# Patient Record
Sex: Male | Born: 2000 | Hispanic: Yes | Marital: Single | Smoking: Current some day smoker
Health system: Southern US, Community
[De-identification: ages and names within clinical notes are randomized; demographics above are authoritative.]

---
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
## 2019-09-29 ENCOUNTER — Ambulatory Visit: Payer: Self-pay | Attending: Internal Medicine

## 2019-09-29 DIAGNOSIS — Z23 Encounter for immunization: Secondary | ICD-10-CM

## 2019-09-29 NOTE — Progress Notes (Signed)
   Covid-19 Vaccination Clinic  Name:  Andrew Serrano    MRN: [id number] DOB: 02-08-2001  09/29/2019  Mr. Andrew Serrano was observed post Covid-19 immunization for 15 minutes without incident. He was provided with Vaccine Information Sheet and instruction to access the V-Safe system.   Mr. Andrew Serrano was instructed to call 911 with any severe reactions post vaccine: Marland Kitchen Difficulty breathing  . Swelling of face and throat  . A fast heartbeat  . A bad rash all over body  . Dizziness and weakness   Immunizations Administered    Name Date Dose VIS Date Route   Pfizer COVID-19 Vaccine 09/29/2019  3:09 PM 0.3 mL 06/21/2019 Intramuscular   Manufacturer: [employer], [employer]   Lot: [id number]   NDC: [address]-5

## 2019-10-20 ENCOUNTER — Ambulatory Visit: Payer: Self-pay

## 2019-10-20 ENCOUNTER — Ambulatory Visit: Payer: Self-pay | Attending: Internal Medicine

## 2019-10-20 DIAGNOSIS — Z23 Encounter for immunization: Secondary | ICD-10-CM

## 2019-10-20 NOTE — Progress Notes (Signed)
   Covid-19 Vaccination Clinic  Name:  Xavi Tomasik    MRN: [id number] DOB: 2001-06-19  10/20/2019  Mr. Galvin Aversa was observed post Covid-19 immunization for 15 minutes without incident. He was provided with Vaccine Information Sheet and instruction to access the V-Safe system.   Mr. Xzavior Reinig was instructed to call 911 with any severe reactions post vaccine: Marland Kitchen Difficulty breathing  . Swelling of face and throat  . A fast heartbeat  . A bad rash all over body  . Dizziness and weakness   Immunizations Administered    Name Date Dose VIS Date Route   Pfizer COVID-19 Vaccine 10/20/2019  2:54 PM 0.3 mL 06/21/2019 Intramuscular   Manufacturer: [employer], [employer]   Lot: [id number]   NDC: [address]-8

## 2021-05-19 ENCOUNTER — Emergency Department
Admission: EM | Admit: 2021-05-19 | Discharge: 2021-05-19 | Disposition: A | Discharge status: Home / Self Care | Payer: Self-pay | Attending: Emergency Medicine | Admitting: Emergency Medicine

## 2021-05-19 ENCOUNTER — Other Ambulatory Visit: Payer: Self-pay

## 2021-05-19 ENCOUNTER — Emergency Department: Payer: Self-pay

## 2021-05-19 DIAGNOSIS — W5501XD Bitten by cat, subsequent encounter: Secondary | ICD-10-CM | POA: Insufficient documentation

## 2021-05-19 DIAGNOSIS — Z23 Encounter for immunization: Secondary | ICD-10-CM | POA: Insufficient documentation

## 2021-05-19 DIAGNOSIS — S91031D Puncture wound without foreign body, right ankle, subsequent encounter: Secondary | ICD-10-CM | POA: Insufficient documentation

## 2021-05-19 DIAGNOSIS — Z2914 Encounter for prophylactic rabies immune globin: Secondary | ICD-10-CM | POA: Insufficient documentation

## 2021-05-19 MED ORDER — IBUPROFEN 400 MG PO TABS
400.0000 mg | ORAL_TABLET | Freq: Once | ORAL | Status: AC
  Administered 2021-05-19: 18:00:00 400 mg via ORAL
  Filled 2021-05-19: qty 1

## 2021-05-19 MED ORDER — RABIES VACCINE, PCEC IM SUSR
1.0000 mL | Freq: Once | INTRAMUSCULAR | Status: AC
  Administered 2021-05-19: 18:00:00 1 mL via INTRAMUSCULAR
  Filled 2021-05-19: qty 1

## 2021-05-19 MED ORDER — ACETAMINOPHEN 500 MG PO TABS
1000.0000 mg | ORAL_TABLET | Freq: Once | ORAL | Status: AC
  Administered 2021-05-19: 18:00:00 1000 mg via ORAL
  Filled 2021-05-19: qty 2

## 2021-05-19 MED ORDER — RABIES IMMUNE GLOBULIN 150 UNIT/ML IM INJ
20.0000 [IU]/kg | INJECTION | Freq: Once | INTRAMUSCULAR | Status: AC
  Administered 2021-05-19: 1425 [IU] via INTRAMUSCULAR
  Filled 2021-05-19: qty 9.5

## 2021-05-19 NOTE — ED Provider Notes (Signed)
[facility] Emergency Department Provider Note  ____________________________________________   Event Date/Time   First MD Initiated Contact with Patient 05/19/21 1654     (approximate)  I have reviewed the triage vital signs and the nursing notes.   HISTORY  Chief Complaint Animal Bite   HPI Andrew Serrano is a 20 y.o. male without significant past medical history who presents for assessment stating he thinks he needs a rabies vaccine after he sustained a cat bite to the right ankle and foot yesterday.  Patient notes he was seen in urgent care and had his tetanus updated and was started on Augmentin but did not get the rabies vaccine and he was waiting to get the records for [place].  He states he adopted his current cath about 6 months ago and after getting records it seems the cat has only received partial rabies series.  Patient states that he otherwise has not had any significant pain other than some mild soreness around the bites in the foot including no pain in the knee, chest, abdomen, back, head, or elsewhere.  She denies any recent fevers, chills, cough, vomiting, diarrhea, burning with urination or any other acute sick symptoms.  No other recent injuries or falls or bites.  He was not been anywhere other than the right foot.         History reviewed. No pertinent past medical history.  There are no problems to display for this patient.   History reviewed. No pertinent surgical history.  Prior to Admission medications   Not on File    Allergies Patient has no known allergies.  No family history on file.  Social History Social History   Tobacco Use   Smoking status: Never   Smokeless tobacco: Never  Substance Use Topics   Alcohol use: No   Drug use: No    Review of Systems  Review of Systems  Constitutional:  Negative for chills and fever.  HENT:  Negative for sore throat.   Eyes:  Negative for pain.  Respiratory:   Negative for cough and stridor.   Cardiovascular:  Negative for chest pain.  Gastrointestinal:  Negative for vomiting.  Musculoskeletal:  Positive for myalgias (R mild discomfort).  Skin:  Negative for rash.  Neurological:  Negative for seizures, loss of consciousness and headaches.  Psychiatric/Behavioral:  Negative for suicidal ideas.   All other systems reviewed and are negative.    ____________________________________________   PHYSICAL EXAM:  VITAL SIGNS: ED Triage Vitals  Enc Vitals Group     BP 05/19/21 1516 131/84     Pulse Rate 05/19/21 1516 64     Resp 05/19/21 1516 16     Temp 05/19/21 1516 99.1 F (37.3 C)     Temp Source 05/19/21 1516 Oral     SpO2 05/19/21 1516 98 %     Weight 05/19/21 1501 160 lb (72.6 kg)     Height 05/19/21 1501 5\' 5"  (1.651 m)     Head Circumference --      Peak Flow --      Pain Score 05/19/21 1501 4     Pain Loc --      Pain Edu? --      Excl. in GC? --    Vitals:   05/19/21 1516  BP: 131/84  Pulse: 64  Resp: 16  Temp: 99.1 F (37.3 C)  SpO2: 98%   Physical Exam Vitals and nursing note reviewed.  Constitutional:  Appearance: He is well-developed.  HENT:     Head: Normocephalic and atraumatic.     Right Ear: External ear normal.     Left Ear: External ear normal.     Nose: Nose normal.  Eyes:     Conjunctiva/sclera: Conjunctivae normal.  Cardiovascular:     Rate and Rhythm: Normal rate and regular rhythm.     Heart sounds: No murmur heard. Pulmonary:     Effort: Pulmonary effort is normal. No respiratory distress.     Breath sounds: Normal breath sounds.  Abdominal:     Palpations: Abdomen is soft.     Tenderness: There is no abdominal tenderness.  Musculoskeletal:     Cervical back: Neck supple.  Skin:    General: Skin is warm and dry.  Neurological:     Mental Status: He is alert and oriented to person, place, and time.  Psychiatric:        Mood and Affect: Mood normal.    There appears be 2 very small  scabbed over puncture wounds on the inferior aspect of the medial malleolus and a scratch over the dorsum of the foot and possibly a puncture wound on the posterior aspect of the lateral malleolus.  2+ DP pulses.  Sensation is full sensation to light touch throughout the foot.  He has full strength at the ankle and is able to move all toes.  There is no effusion or significant edema induration purulent drainage or other skin changes. ____________________________________________   LABS (all labs ordered are listed, but only abnormal results are displayed)  Labs Reviewed - No data to display ____________________________________________  EKG   ____________________________________________  RADIOLOGY  ED MD interpretation: Film of the right ankle shows no retained teeth or underlying bony injury.  Official radiology report(s): DG Ankle Complete Right  Result Date: 05/19/2021 CLINICAL DATA:  Cat bite sustained yesterday. EXAM: RIGHT ANKLE - COMPLETE 3+ VIEW COMPARISON:  None. FINDINGS: There is no evidence of fracture, dislocation, or joint effusion. There is no evidence of arthropathy or other focal bone abnormality. Soft tissues are unremarkable. IMPRESSION: Normal radiographs. Electronically Signed   By: Paulina Fusi M.D.   On: 05/19/2021 17:26    ____________________________________________   PROCEDURES  Procedure(s) performed (including Critical Care):  Procedures   ____________________________________________   INITIAL IMPRESSION / ASSESSMENT AND PLAN / ED COURSE      Patient presents with above-stated history exam after reportedly being bit by his cat yesterday.  It seems he was seen in urgent care where he had his tetanus updated and was started on Augmentin but now thinks he needs to have rabies vaccine started as his cat is 11 partially vaccinated.  On arrival patient is afebrile hemodynamically stable.  On exam he does what appears to be a superficial scratch on the  dorsum of the foot and a couple small puncture wounds noted above.  X-ray shows no retained teeth or underlying bony injury.  He is otherwise neurovascularly intact and I have a low suspicion for other significant trauma or compartment syndrome.  He is on appropriate antibiotics.  Given he was bit by a cat that has not been fully up-to-date on rabies we will start series in the emergency room including 1 dose of immunoglobulin.  Discussed with patient's portance of close outpatient urgent care follow-up to complete full series and returning immediately if he experiences any new or worsening pain in his foot, fevers, spreading redness or purulent drainage.  He is amenable with plan.  Discharged stable condition.  Strict return precautions advised and discussed.       ____________________________________________   FINAL CLINICAL IMPRESSION(S) / ED DIAGNOSES  Final diagnoses:  Cat bite, subsequent encounter    Medications  rabies vaccine (RABAVERT) injection 1 mL (1 mL Intramuscular Given 05/19/21 1801)  rabies immune globulin (HYPERAB/KEDRAB) injection 1,425 Units (1,425 Units Intramuscular Given 05/19/21 1800)  acetaminophen (TYLENOL) tablet 1,000 mg (1,000 mg Oral Given 05/19/21 1759)  ibuprofen (ADVIL) tablet 400 mg (400 mg Oral Given 05/19/21 1800)     ED Discharge Orders     None        Note:  This document was prepared using Dragon voice recognition software and may include unintentional dictation errors.    Gilles Chiquito, MD 05/19/21 [phone]

## 2021-05-19 NOTE — ED Triage Notes (Signed)
Pt to ED for second opinion on cat bite that occurred yesterday. Pt reports was seen by Dr and px antibiotics. Now needs rabies vaccines. Bite to right foot

## 2021-05-22 ENCOUNTER — Ambulatory Visit
Admission: EM | Admit: 2021-05-22 | Discharge: 2021-05-22 | Disposition: A | Discharge status: Home / Self Care | Payer: Self-pay | Attending: Emergency Medicine | Admitting: Emergency Medicine

## 2021-05-22 ENCOUNTER — Encounter: Payer: Self-pay | Admitting: Emergency Medicine

## 2021-05-22 ENCOUNTER — Other Ambulatory Visit: Payer: Self-pay

## 2021-05-22 DIAGNOSIS — Z23 Encounter for immunization: Secondary | ICD-10-CM

## 2021-05-22 MED ORDER — RABIES VACCINE, PCEC IM SUSR
1.0000 mL | Freq: Once | INTRAMUSCULAR | Status: AC
  Administered 2021-05-22: 1 mL via INTRAMUSCULAR

## 2021-05-22 NOTE — ED Triage Notes (Signed)
Pt presents for 2nd Rabies Vaccine. 

## 2021-06-02 ENCOUNTER — Other Ambulatory Visit: Payer: Self-pay

## 2021-06-02 ENCOUNTER — Ambulatory Visit
Admission: EM | Admit: 2021-06-02 | Discharge: 2021-06-02 | Disposition: A | Discharge status: Home / Self Care | Payer: Self-pay | Attending: Internal Medicine | Admitting: Internal Medicine

## 2021-06-02 DIAGNOSIS — Z203 Contact with and (suspected) exposure to rabies: Secondary | ICD-10-CM

## 2021-06-02 MED ORDER — RABIES VACCINE, PCEC IM SUSR
1.0000 mL | Freq: Once | INTRAMUSCULAR | Status: AC
  Administered 2021-06-02: 1 mL via INTRAMUSCULAR

## 2021-06-02 NOTE — ED Triage Notes (Signed)
Pt here for last rabies shot. Received the last one in [place] in [place].

## 2021-07-09 ENCOUNTER — Emergency Department
Admission: EM | Admit: 2021-07-09 | Discharge: 2021-07-09 | Disposition: A | Discharge status: Home / Self Care | Payer: Self-pay | Attending: Emergency Medicine | Admitting: Emergency Medicine

## 2021-07-09 ENCOUNTER — Emergency Department: Payer: Self-pay

## 2021-07-09 ENCOUNTER — Other Ambulatory Visit: Payer: Self-pay

## 2021-07-09 DIAGNOSIS — R109 Unspecified abdominal pain: Secondary | ICD-10-CM | POA: Insufficient documentation

## 2021-07-09 DIAGNOSIS — R079 Chest pain, unspecified: Secondary | ICD-10-CM

## 2021-07-09 DIAGNOSIS — R197 Diarrhea, unspecified: Secondary | ICD-10-CM | POA: Insufficient documentation

## 2021-07-09 DIAGNOSIS — Z20822 Contact with and (suspected) exposure to covid-19: Secondary | ICD-10-CM | POA: Insufficient documentation

## 2021-07-09 DIAGNOSIS — R0789 Other chest pain: Secondary | ICD-10-CM | POA: Insufficient documentation

## 2021-07-09 DIAGNOSIS — R6883 Chills (without fever): Secondary | ICD-10-CM | POA: Insufficient documentation

## 2021-07-09 LAB — CBC
HCT: 45.8 % (ref 39.0–52.0)
Hemoglobin: 15.8 g/dL (ref 13.0–17.0)
MCH: 28.6 pg (ref 26.0–34.0)
MCHC: 34.5 g/dL (ref 30.0–36.0)
MCV: 83 fL (ref 80.0–100.0)
Platelets: 256 10*3/uL (ref 150–400)
RBC: 5.52 MIL/uL (ref 4.22–5.81)
RDW: 13.1 % (ref 11.5–15.5)
WBC: 6.6 10*3/uL (ref 4.0–10.5)
nRBC: 0 % (ref 0.0–0.2)

## 2021-07-09 LAB — HEPATIC FUNCTION PANEL
ALT: 37 U/L (ref 0–44)
AST: 23 U/L (ref 15–41)
Albumin: 4.3 g/dL (ref 3.5–5.0)
Alkaline Phosphatase: 70 U/L (ref 38–126)
Bilirubin, Direct: <0.1 mg/dL (ref 0.0–0.2)
Total Bilirubin: 0.7 mg/dL (ref 0.3–1.2)
Total Protein: 7.3 g/dL (ref 6.5–8.1)

## 2021-07-09 LAB — RESP PANEL BY RT-PCR (FLU A&B, COVID) ARPGX2
Influenza A by PCR: NEGATIVE
Influenza B by PCR: NEGATIVE
SARS Coronavirus 2 by RT PCR: NEGATIVE

## 2021-07-09 LAB — BASIC METABOLIC PANEL
Anion gap: 6 (ref 5–15)
BUN: 10 mg/dL (ref 6–20)
CO2: 27 mmol/L (ref 22–32)
Calcium: 9.1 mg/dL (ref 8.9–10.3)
Chloride: 103 mmol/L (ref 98–111)
Creatinine, Ser: 0.68 mg/dL (ref 0.61–1.24)
GFR, Estimated: >60 mL/min (ref >60)
Glucose, Bld: 101 mg/dL — ABNORMAL HIGH (ref 70–99)
Potassium: 3.8 mmol/L (ref 3.5–5.1)
Sodium: 136 mmol/L (ref 135–145)

## 2021-07-09 LAB — URINALYSIS, ROUTINE W REFLEX MICROSCOPIC
Bilirubin Urine: NEGATIVE
Glucose, UA: NEGATIVE mg/dL
Hgb urine dipstick: NEGATIVE
Ketones, ur: NEGATIVE mg/dL
Leukocytes,Ua: NEGATIVE
Nitrite: NEGATIVE
Protein, ur: NEGATIVE mg/dL
Specific Gravity, Urine: 1.018 (ref 1.005–1.030)
pH: 5 (ref 5.0–8.0)

## 2021-07-09 LAB — TROPONIN I (HIGH SENSITIVITY)
Troponin I (High Sensitivity): 4 ng/L (ref <18)
Troponin I (High Sensitivity): 9 ng/L (ref <18)

## 2021-07-09 LAB — LIPASE, BLOOD: Lipase: 25 U/L (ref 11–51)

## 2021-07-09 MED ORDER — ACETAMINOPHEN 500 MG PO TABS
1000.0000 mg | ORAL_TABLET | Freq: Once | ORAL | Status: AC
  Administered 2021-07-09: 08:00:00 1000 mg via ORAL
  Filled 2021-07-09: qty 2

## 2021-07-09 NOTE — ED Provider Notes (Signed)
[facility] Emergency Department Provider Note  ____________________________________________   Event Date/Time   First MD Initiated Contact with Patient 07/09/21 [phone]     (approximate)  I have reviewed the triage vital signs and the nursing notes.   HISTORY  Chief Complaint Chest Pain   HPI Andrew Serrano is a 20 y.o. male without significant past medical history who presents for assessment of a constellation of symptoms including some bilateral chest tightness associate with some crampy abdominal pain and a little diarrhea.  Yesterday.  Seems the chest tightness started yesterday.  Patient states he felt some chills the night before.  No fevers, cough, shortness of breath, back pain, headache, earache, sore throat, vomiting, urinary symptoms, rash or extremity pain.  Comparison none episodes.  He denies EtOH use illicit drug use or tobacco abuse.  No other acute concerns at this time.  No analgesia prior to arrival.         History reviewed. No pertinent past medical history.  There are no problems to display for this patient.   History reviewed. No pertinent surgical history.  Prior to Admission medications   Not on File    Allergies Patient has no known allergies.  History reviewed. No pertinent family history.  Social History Social History   Tobacco Use   Smoking status: Never   Smokeless tobacco: Never  Vaping Use   Vaping Use: Never used  Substance Use Topics   Alcohol use: No   Drug use: No    Review of Systems  Review of Systems  Constitutional:  Positive for chills. Negative for fever.  HENT:  Negative for sore throat.   Eyes:  Negative for pain.  Respiratory:  Negative for cough and stridor.   Cardiovascular:  Positive for chest pain.  Gastrointestinal:  Positive for abdominal pain and diarrhea. Negative for vomiting.  Skin:  Negative for rash.  Neurological:  Negative for seizures, loss of consciousness and  headaches.  Psychiatric/Behavioral:  Negative for suicidal ideas.   All other systems reviewed and are negative.    ____________________________________________   PHYSICAL EXAM:  VITAL SIGNS: ED Triage Vitals  Enc Vitals Group     BP 07/09/21 0044 (!) 121/109     Pulse Rate 07/09/21 0044 92     Resp 07/09/21 0044 18     Temp 07/09/21 0044 98.7 F (37.1 C)     Temp Source 07/09/21 0044 Oral     SpO2 07/09/21 0044 98 %     Weight 07/09/21 0046 180 lb (81.6 kg)     Height 07/09/21 0046 5\' 5"  (1.651 m)     Head Circumference --      Peak Flow --      Pain Score 07/09/21 0044 7     Pain Loc --      Pain Edu? --      Excl. in GC? --    Vitals:   07/09/21 0631 07/09/21 0758  BP: (!) 139/105 (!) 138/98  Pulse: 73 70  Resp: 18 18  Temp: 97.8 F (36.6 C)   SpO2: 98% 98%   Physical Exam Vitals and nursing note reviewed.  Constitutional:      General: He is not in acute distress.    Appearance: He is well-developed.  HENT:     Head: Normocephalic and atraumatic.  Eyes:     Conjunctiva/sclera: Conjunctivae normal.  Cardiovascular:     Rate and Rhythm: Normal rate and regular rhythm.  Heart sounds: No murmur heard. Pulmonary:     Effort: Pulmonary effort is normal. No respiratory distress.     Breath sounds: Normal breath sounds.  Abdominal:     Palpations: Abdomen is soft.     Tenderness: There is no abdominal tenderness.  Musculoskeletal:        General: No swelling.     Cervical back: Neck supple.  Skin:    General: Skin is warm and dry.     Capillary Refill: Capillary refill takes less than 2 seconds.  Neurological:     Mental Status: He is alert.  Psychiatric:        Mood and Affect: Mood normal.     ____________________________________________   LABS (all labs ordered are listed, but only abnormal results are displayed)  Labs Reviewed  BASIC METABOLIC PANEL - Abnormal; Notable for the following components:      Result Value   Glucose, Bld 101 (*)     All other components within normal limits  URINALYSIS, ROUTINE W REFLEX MICROSCOPIC - Abnormal; Notable for the following components:   Color, Urine YELLOW (*)    APPearance CLEAR (*)    All other components within normal limits  RESP PANEL BY RT-PCR (FLU A&B, COVID) ARPGX2  CBC  HEPATIC FUNCTION PANEL  LIPASE, BLOOD  TROPONIN I (HIGH SENSITIVITY)  TROPONIN I (HIGH SENSITIVITY)   ____________________________________________  EKG  ECG remarkable sinus rhythm with sinus arrhythmia with a ventricular rate of 96, normal axis, unremarkable intervals without evidence of acute ischemia or significant arrhythmia. ____________________________________________  RADIOLOGY  ED MD interpretation: Chest x-ray shows no focal consolidation, effusion, edema, pneumothorax or other clear acute thoracic process.  Official radiology report(s): DG Chest 2 View  Result Date: 07/09/2021 CLINICAL DATA:  Chest pain. EXAM: CHEST - 2 VIEW COMPARISON:  None. FINDINGS: The heart size and mediastinal contours are within normal limits. Both lungs are clear. The visualized skeletal structures are unremarkable. IMPRESSION: No active cardiopulmonary disease. Electronically Signed   By: Elgie Collard M.D.   On: 07/09/2021 01:08    ____________________________________________   PROCEDURES  Procedure(s) performed (including Critical Care):  Procedures   ____________________________________________   INITIAL IMPRESSION / ASSESSMENT AND PLAN / ED COURSE        Patient presents with above described history and exam.  On arrival he is afebrile hemodynamically stable.  Primary differential includes acute viral process, ACS, arrhythmia, PE, pneumonia, enteritis, cholecystitis, pancreatitis and metabolic derangements.  ECG remarkable sinus rhythm with sinus arrhythmia with a ventricular rate of 96, normal axis, unremarkable intervals without evidence of acute ischemia or significant arrhythmia.  Given  nonelevated troponin x2 Evalose patient for ACS or myocarditis.  Low suspicion for PE as patient is PERC negative.  Very low suspicion for dissection at this time.  COVID and influenza test is negative.  Hepatic function panel is unremarkable and there is no focal tenderness on upper quadrant to suggest cholestatic process.  No right lower quadrant focal tenderness to suggest appendicitis.  UA is unremarkable for evidence of blood or infection.  Lipase not consistent pancreatitis.  CBC without leukocytosis or acute anemia.  BMP without significant electrolyte or metabolic derangements  Chest x-ray shows no focal consolidation, effusion, edema, pneumothorax or other clear acute thoracic process.  Overall low suspicion for immediate life-threatening process and given stable vitals with reassuring exam work-up I think patient stable for discharge with outpatient follow-up.  Discharged in stable condition.  Strict return precautions advised and discussed.  Impression is  likely acute viral syndrome.      ____________________________________________   FINAL CLINICAL IMPRESSION(S) / ED DIAGNOSES  Final diagnoses:  Chest pain, unspecified type    Medications  acetaminophen (TYLENOL) tablet 1,000 mg (1,000 mg Oral Given 07/09/21 7628)     ED Discharge Orders     None        Note:  This document was prepared using Dragon voice recognition software and may include unintentional dictation errors.    Gilles Chiquito, MD 07/09/21 1256

## 2021-07-09 NOTE — ED Triage Notes (Signed)
Pt to ED via POV. Pt started having chest tightness this afternoon, had nausea and abdominal pain yesterday, has had shortness of breath.  Pt c/o fever last night.  NAD noted. Pt states he has been under quite a bit of stress lately as well.

## 2021-07-09 NOTE — ED Notes (Addendum)
See triage note  presents with some upper abd pain and chest pain describes pain as tightness  NAD on arrival to room

## 2022-04-30 IMAGING — CR DG CHEST 2V
2 series · 2 of 2 positions shown · non-contrast
Comparison: None.

CLINICAL DATA: Chest pain.

EXAM:
CHEST - 2 VIEW

[chest pa]
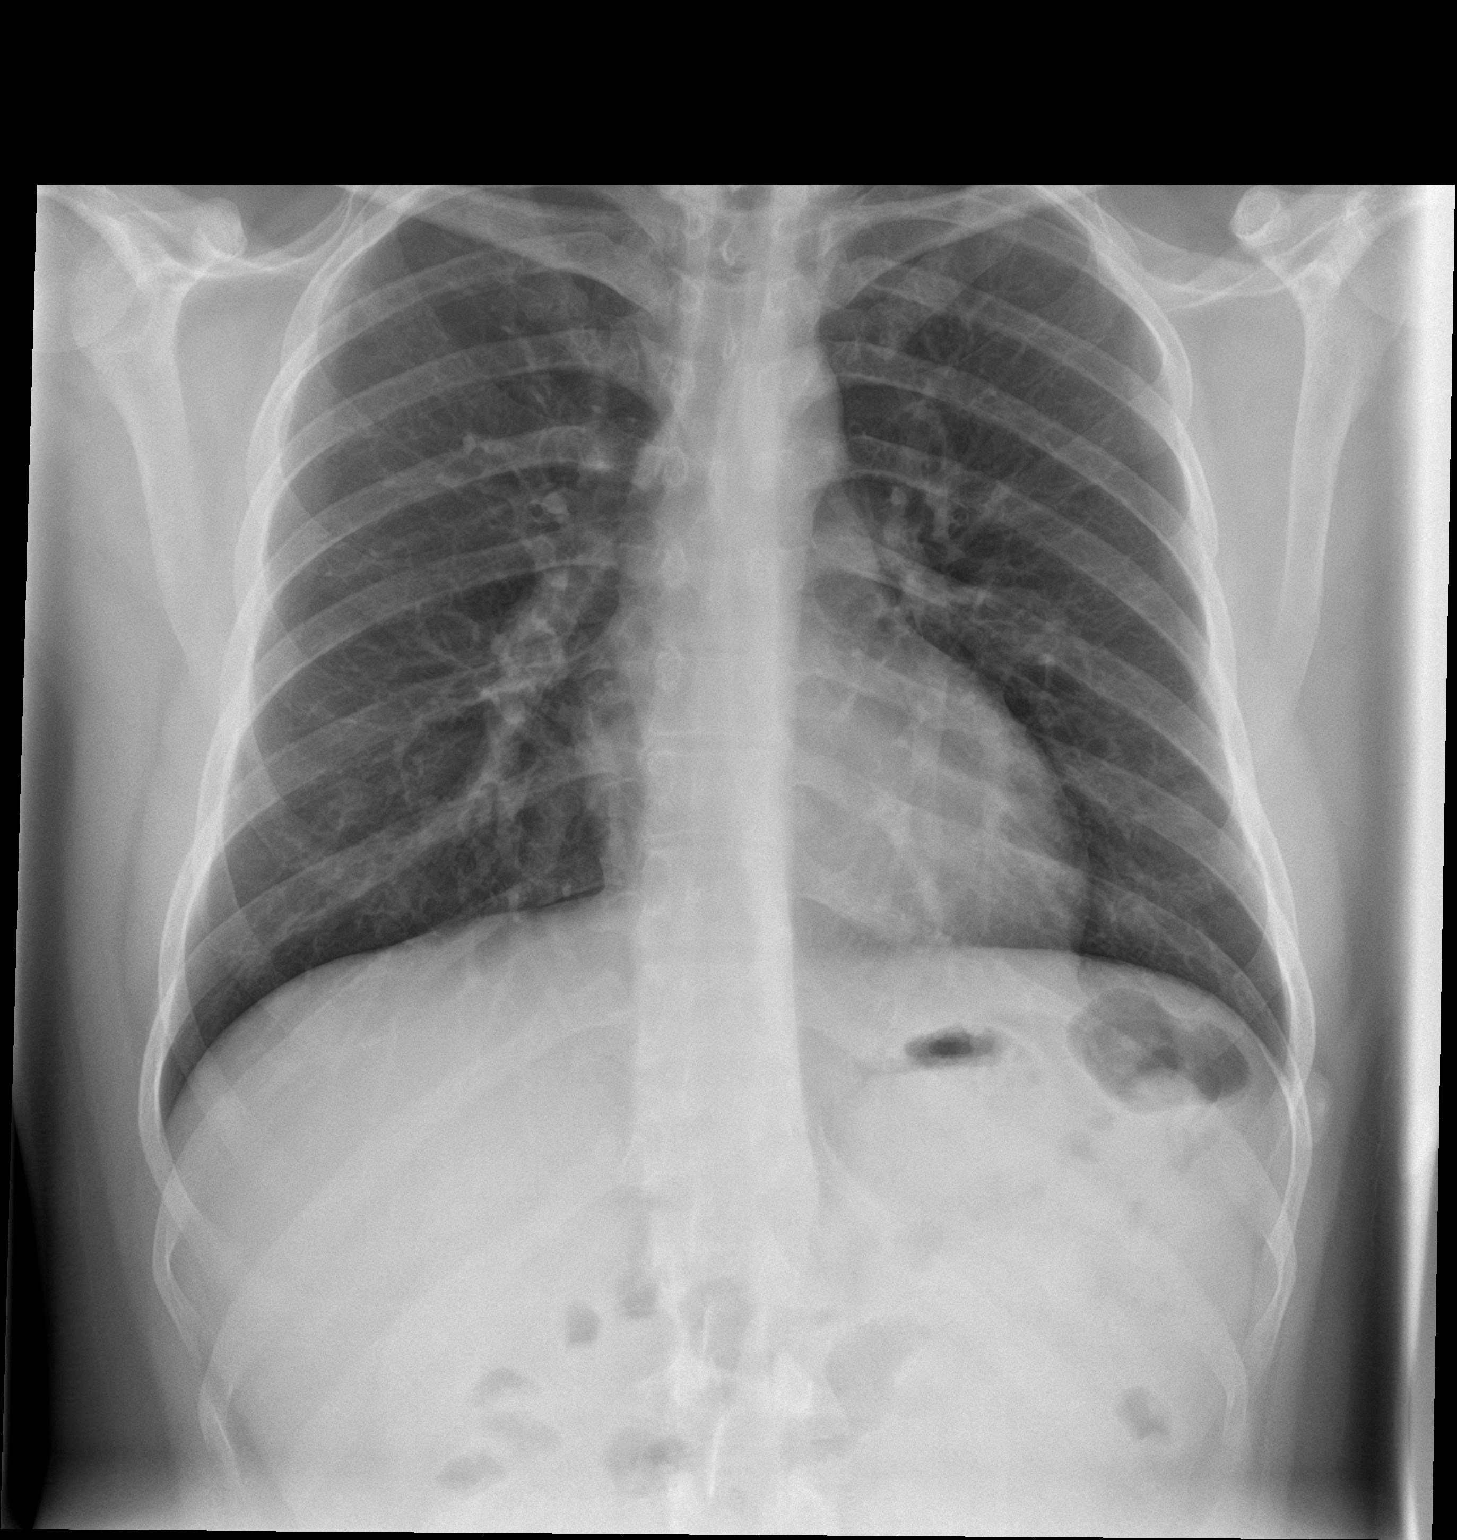

[chest lat]
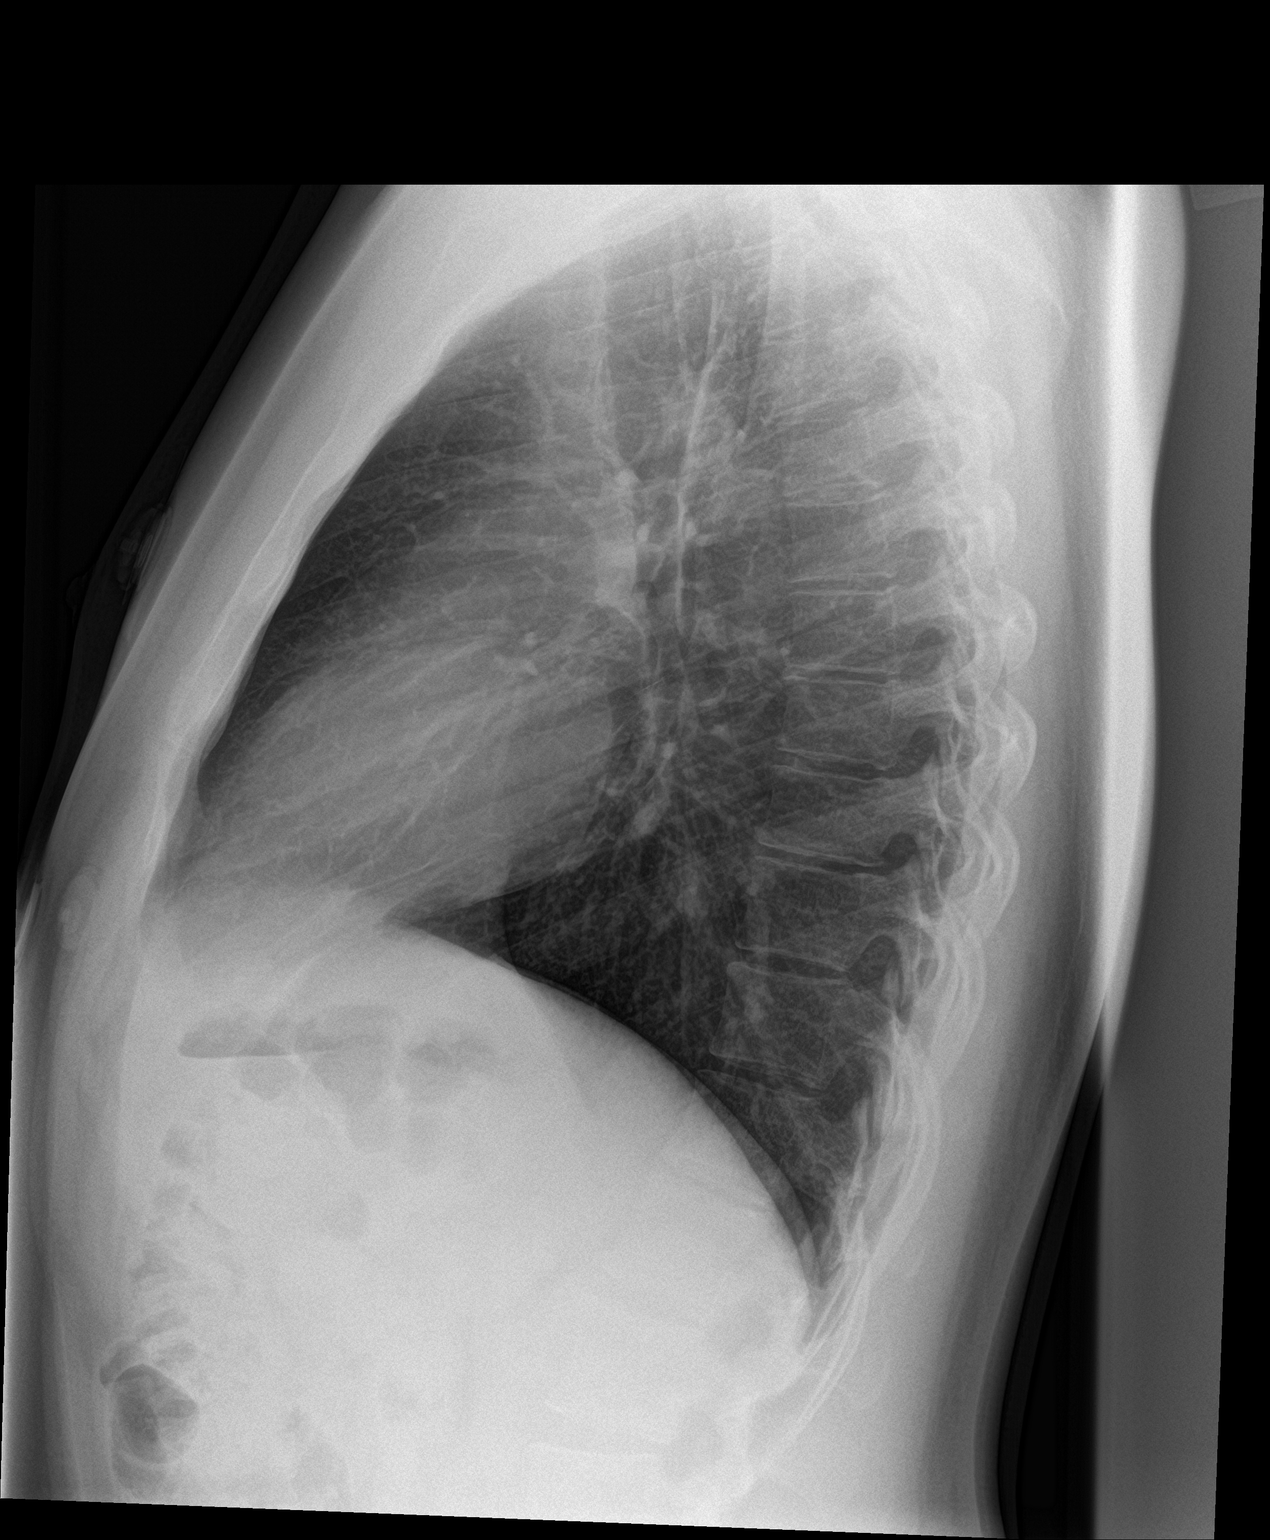

[2 of 2 positions shown; findings below may reference images not displayed]

FINDINGS: The heart size and mediastinal contours are within normal limits.
Both lungs are clear. The visualized skeletal structures are
unremarkable.
IMPRESSION: No active cardiopulmonary disease.

## 2022-11-10 DIAGNOSIS — F439 Reaction to severe stress, unspecified: Secondary | ICD-10-CM | POA: Insufficient documentation

## 2022-11-11 ENCOUNTER — Other Ambulatory Visit: Payer: Self-pay

## 2022-11-11 ENCOUNTER — Emergency Department
Admission: EM | Admit: 2022-11-11 | Discharge: 2022-11-11 | Disposition: A | Discharge status: Home / Self Care | Payer: Self-pay | Attending: Emergency Medicine | Admitting: Emergency Medicine

## 2022-11-11 ENCOUNTER — Encounter: Payer: Self-pay | Admitting: Emergency Medicine

## 2022-11-11 ENCOUNTER — Emergency Department: Payer: Self-pay

## 2022-11-11 DIAGNOSIS — R0789 Other chest pain: Secondary | ICD-10-CM

## 2022-11-11 LAB — CBC
HCT: 44.4 % (ref 39.0–52.0)
Hemoglobin: 15.4 g/dL (ref 13.0–17.0)
MCH: 29.4 pg (ref 26.0–34.0)
MCHC: 34.7 g/dL (ref 30.0–36.0)
MCV: 84.7 fL (ref 80.0–100.0)
Platelets: 283 10*3/uL (ref 150–400)
RBC: 5.24 MIL/uL (ref 4.22–5.81)
RDW: 12.9 % (ref 11.5–15.5)
WBC: 9 10*3/uL (ref 4.0–10.5)
nRBC: 0 % (ref 0.0–0.2)

## 2022-11-11 LAB — BASIC METABOLIC PANEL
Anion gap: 10 (ref 5–15)
BUN: 15 mg/dL (ref 6–20)
CO2: 25 mmol/L (ref 22–32)
Calcium: 9.4 mg/dL (ref 8.9–10.3)
Chloride: 103 mmol/L (ref 98–111)
Creatinine, Ser: 0.81 mg/dL (ref 0.61–1.24)
GFR, Estimated: >60 mL/min (ref >60)
Glucose, Bld: 105 mg/dL — ABNORMAL HIGH (ref 70–99)
Potassium: 3.2 mmol/L — ABNORMAL LOW (ref 3.5–5.1)
Sodium: 138 mmol/L (ref 135–145)

## 2022-11-11 LAB — TROPONIN I (HIGH SENSITIVITY)
Troponin I (High Sensitivity): 3 ng/L (ref <18)
Troponin I (High Sensitivity): 4 ng/L (ref <18)

## 2022-11-11 LAB — D-DIMER, QUANTITATIVE: D-Dimer, Quant: <0.27 ug/mL-FEU (ref 0.00–0.50)

## 2022-11-11 NOTE — ED Provider Notes (Signed)
[facility] Provider Note    Event Date/Time   First MD Initiated Contact with Patient 11/11/22 0153     (approximate)   History   Chest Pain   HPI Andrew Serrano is a 22 y.o. male who is otherwise healthy.  He presents for evaluation of a couple of episodes of some mild left-sided chest pain that occasionally radiates into his left shoulder and his left arm.  This is new for him so he took noted that it was somewhat concerned.  He has not had any shortness of breath, nausea, vomiting, episodes of sweating, lightheadedness, dizziness, nor passing out.  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  He has no history of blood clots in his legs or his lungs and has not had any unilateral leg pain or swelling.  No recent surgeries nor immobilizations.  He is currently asymptomatic.     Physical Exam   Triage Vital Signs: ED Triage Vitals  Enc Vitals Group     BP 11/11/22 0002 (!) 143/103     Pulse Rate 11/11/22 0002 (!) 104     Resp 11/11/22 0002 20     Temp 11/11/22 0002 98.5 F (36.9 C)     Temp Source 11/11/22 0002 Oral     SpO2 11/11/22 0002 100 %     Weight 11/11/22 0007 77.1 kg (170 lb)     Height 11/11/22 0007 1.676 m (5\' 6" )     Head Circumference --      Peak Flow --      Pain Score --      Pain Loc --      Pain Edu? --      Excl. in GC? --     Most recent vital signs: Vitals:   11/11/22 0230 11/11/22 0300  BP: 133/84 134/86  Pulse: 77 80  Resp: 18   Temp:    SpO2: 97% 98%    General: Awake, no distress.  CV:  Good peripheral perfusion.  Normal heart sounds, regular rate and rhythm.  No pain in the chest resulting from either leaning forward or lying supine.  No reproducible chest wall tenderness. Resp:  Normal effort. Speaking easily and comfortably, no accessory muscle  usage nor intercostal retractions.  Lungs are clear to auscultation. Abd:  No distention.  No tenderness to palpation of the upper abdomen with negative Murphy sign.   ED Results / Procedures / Treatments   Labs (all labs ordered are listed, but only abnormal results are displayed) Labs Reviewed  BASIC METABOLIC PANEL - Abnormal; Notable for the following components:      Result Value   Potassium 3.2 (*)    Glucose, Bld 105 (*)    All other components within normal limits  CBC  D-DIMER, QUANTITATIVE (NOT AT [facility])  TROPONIN I (HIGH SENSITIVITY)  TROPONIN I (HIGH SENSITIVITY)     EKG  ED ECG REPORT I, Loleta Rose, the attending physician, personally viewed and interpreted this ECG.  Date: 11/11/2022 EKG Time: 00: 04 Rate: 101 Rhythm: normal sinus rhythm QRS Axis: normal Intervals: normal ST/T Wave abnormalities: Non-specific ST segment / T-wave changes, but no clear evidence of acute ischemia. Narrative Interpretation: no definitive evidence of acute ischemia; does not meet STEMI criteria.    RADIOLOGY I viewed  and interpreted the patient's two-view chest x-ray.  I see no evidence of pneumonia, pneumothorax, nor other acute or emergent condition.  I also read the radiologist's report, which confirmed no acute findings.   PROCEDURES:  Critical Care performed: No  .1-3 Lead EKG Interpretation  Performed by: Loleta Rose, MD Authorized by: Loleta Rose, MD     Interpretation: normal     ECG rate:  80   ECG rate assessment: normal     Rhythm: sinus rhythm     Ectopy: none     Conduction: normal       IMPRESSION / MDM / ASSESSMENT AND PLAN / ED COURSE  I reviewed the triage vital signs and the nursing notes.                              Differential diagnosis includes, but is not limited to, stress/anxiety, musculoskeletal strain, angina, ACS including unstable angina, PE, pneumonia, pneumothorax, myocarditis/pericarditis.  Patient's presentation is most  consistent with acute presentation with potential threat to life or bodily function.  Labs/studies ordered: CBC, BMP, high-sensitivity troponin x 2, D-dimer, EKG, two-view chest x-ray  Interventions/Medications given:  Medications - No data to display  (Note:  hospital course my include additional interventions and/or labs/studies not listed above.)   Patient is low risk for ACS based on HEAR score.  He initially had some mild tachycardia which I suspect was situational.  His heart rate has since resolved, but he also reports doing a lot of driving around.  I think it is very unlikely that he has a PE but I will check a D-dimer to make sure.  Similarly I strongly doubt ACS but I will check a second troponin.  His initial labs  The patient is on the cardiac monitor to evaluate for evidence of arrhythmia and/or significant heart rate changes.  Are essentially normal other than some mild hypokalemia which is likely not clinically relevant.  I talked with the patient about following up as an outpatient if everything is reassuring and he agrees with the plan and does not want to stay in the hospital unless it is absolutely necessary.  Clinical Course as of 11/11/22 0430  Fri Nov 11, 2022  0410 Troponin I (High Sensitivity): 3 Normal repeat troponin [CF]  0425 D-dimer is also within normal limits.  Patient is asymptomatic.  I provided reassurance and will refer him to a primary care provider to follow-up.  I gave my usual and customary return precautions. [CF]    Clinical Course User Index [CF] Loleta Rose, MD     FINAL CLINICAL IMPRESSION(S) / ED DIAGNOSES   Final diagnoses:  Atypical chest pain     Rx / DC Orders   ED Discharge Orders          Ordered    Ambulatory Referral to Primary Care (Establish Care)        11/11/22 0430             Note:  This document was prepared using Dragon voice recognition software and may include unintentional dictation errors.   Loleta Rose, MD 11/11/22 0430

## 2022-11-11 NOTE — ED Triage Notes (Signed)
Patient ambulatory to triage with steady gait, without difficulty or distress noted; pt reports last few days having intermittent left sided CP radiating into left shoulder/arm; denies any accomp symptoms; denies hx of same

## 2022-11-11 NOTE — Discharge Instructions (Addendum)
Your workup in the Emergency Department today was reassuring.  We did not find any specific abnormalities.  We recommend you drink plenty of fluids, take your regular medications and/or any new ones prescribed today, and follow up with the doctor(s) listed in these documents as recommended.  Return to the Emergency Department if you develop new or worsening symptoms that concern you.  

## 2022-12-05 ENCOUNTER — Emergency Department
Admission: EM | Admit: 2022-12-05 | Discharge: 2022-12-05 | Disposition: A | Discharge status: Home / Self Care | Payer: Self-pay | Attending: Emergency Medicine | Admitting: Emergency Medicine

## 2022-12-05 DIAGNOSIS — H7291 Unspecified perforation of tympanic membrane, right ear: Secondary | ICD-10-CM | POA: Insufficient documentation
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
## 2022-12-05 NOTE — ED Notes (Signed)
Pt provided discharge instructions and prescription information. Pt was given the opportunity to ask questions and questions were answered.   

## 2023-02-07 ENCOUNTER — Ambulatory Visit: Payer: Self-pay | Admitting: Internal Medicine

## 2023-02-07 ENCOUNTER — Encounter: Payer: Self-pay | Admitting: Internal Medicine

## 2023-02-07 VITALS — BP 128/88 | HR 81 | Temp 98.1°F | Resp 16 | Ht 66.0 in | Wt 175.9 lb

## 2023-02-07 DIAGNOSIS — R196 Halitosis: Secondary | ICD-10-CM

## 2023-02-07 DIAGNOSIS — Z1159 Encounter for screening for other viral diseases: Secondary | ICD-10-CM

## 2023-02-07 DIAGNOSIS — Z114 Encounter for screening for human immunodeficiency virus [HIV]: Secondary | ICD-10-CM

## 2023-02-07 DIAGNOSIS — M545 Low back pain, unspecified: Secondary | ICD-10-CM

## 2023-02-07 DIAGNOSIS — R7309 Other abnormal glucose: Secondary | ICD-10-CM

## 2023-02-07 DIAGNOSIS — E876 Hypokalemia: Secondary | ICD-10-CM

## 2023-02-07 DIAGNOSIS — G8929 Other chronic pain: Secondary | ICD-10-CM

## 2023-02-07 NOTE — Progress Notes (Signed)
New Patient Office Visit  Subjective    Patient ID: Andrew Serrano, male    DOB: 15-May-2001  Age: 22 y.o. MRN: [id number]  CC:  Chief Complaint  Patient presents with   Establish Care   Back Pain    Onset for years, left side mainly when pt is bending over    Bad breath    HPI Aricin Whittemore presents to establish care. He has no chronic medical conditions and takes no daily medications. Patient was recently seen in the ER for perforation of left TM. No more pain, hearing better. Labs at the time showed potassium 3.2, glucose slightly elevated at 105.   BACK PAIN Duration:  chronic Mechanism of injury: unknown Location: Left and low back Quality: dull and aching Frequency: intermittent Radiation: none Aggravating factors: bending and prolonged sitting Alleviating factors: stretching, extending, moving Status: fluctuating  Halitosis:  -Brushes teeth and uses mouth wash, gum -Worse in the morning, usually doesn't eat breakfast   Health Maintenance: -Blood work due -Tdap - thinks he had it in 2022 -HPV vaccines - thinks he had them when he was kid, will bring records   No outpatient encounter medications on file as of 02/07/2023.   No facility-administered encounter medications on file as of 02/07/2023.    History reviewed. No pertinent past medical history.  History reviewed. No pertinent surgical history.  Family History  Problem Relation Age of Onset   Diabetes Father     Social History   Socioeconomic History   Marital status: Single    Spouse name: Not on file   Number of children: Not on file   Years of education: Not on file   Highest education level: Not on file  Occupational History   Not on file  Tobacco Use   Smoking status: Some Days    Types: Cigars   Smokeless tobacco: Never  Vaping Use   Vaping status: Never Used  Substance and Sexual Activity   Alcohol use: Yes    Comment: Socially   Drug use: No   Sexual  activity: Yes  Other Topics Concern   Not on file  Social History Narrative   Not on file   Social Determinants of Health   Financial Resource Strain: Not on file  Food Insecurity: Not on file  Transportation Needs: Not on file  Physical Activity: Not on file  Stress: Not on file  Social Connections: Not on file  Intimate Partner Violence: Not on file    Review of Systems  Constitutional:  Negative for chills and fever.  HENT:  Negative for ear pain, hearing loss and tinnitus.   Gastrointestinal:  Positive for heartburn. Negative for abdominal pain.  Musculoskeletal:  Positive for back pain.        Objective    BP 128/88   Pulse 81   Temp 98.1 F (36.7 C) (Oral)   Resp 16   Ht 5\' 6"  (1.676 m)   Wt 175 lb 14.4 oz (79.8 kg)   SpO2 98%   BMI 28.39 kg/m   Physical Exam Constitutional:      Appearance: Normal appearance.  HENT:     Head: Normocephalic and atraumatic.     Right Ear: Tympanic membrane, ear canal and external ear normal.     Left Ear: Tympanic membrane, ear canal and external ear normal.     Mouth/Throat:     Mouth: Mucous membranes are moist.     Comments: Mild PND Eyes:  Conjunctiva/sclera: Conjunctivae normal.  Cardiovascular:     Rate and Rhythm: Normal rate and regular rhythm.  Pulmonary:     Effort: Pulmonary effort is normal.     Breath sounds: Normal breath sounds.  Musculoskeletal:     Lumbar back: Tenderness present. No swelling or bony tenderness. Normal range of motion.     Comments: Hypertonic left paraspinal muscles at the level of L2, no abnormal curvature of the spine  Skin:    General: Skin is warm and dry.  Neurological:     General: No focal deficit present.     Mental Status: He is alert. Mental status is at baseline.  Psychiatric:        Mood and Affect: Mood normal.        Behavior: Behavior normal.     Last CBC Lab Results  Component Value Date   WBC 9.0 11/11/2022   HGB 15.4 11/11/2022   HCT 44.4  11/11/2022   MCV 84.7 11/11/2022   MCH 29.4 11/11/2022   RDW 12.9 11/11/2022   PLT 283 11/11/2022   Last metabolic panel Lab Results  Component Value Date   GLUCOSE 105 (H) 11/11/2022   NA 138 11/11/2022   K 3.2 (L) 11/11/2022   CL 103 11/11/2022   CO2 25 11/11/2022   BUN 15 11/11/2022   CREATININE 0.81 11/11/2022   GFRNONAA >60 11/11/2022   CALCIUM 9.4 11/11/2022   PROT 7.3 07/09/2021   ALBUMIN 4.3 07/09/2021   BILITOT 0.7 07/09/2021   ALKPHOS 70 07/09/2021   AST 23 07/09/2021   ALT 37 07/09/2021   ANIONGAP 10 11/11/2022   Last lipids No results found for: "CHOL", "HDL", "LDLCALC", "LDLDIRECT", "TRIG", "CHOLHDL" Last hemoglobin A1c No results found for: "HGBA1C" Last thyroid functions No results found for: "TSH", "T3TOTAL", "T4TOTAL", "THYROIDAB" Last vitamin D No results found for: "25OHVITD2", "25OHVITD3", "VD25OH" Last vitamin B12 and Folate No results found for: "VITAMINB12", "FOLATE"      Assessment & Plan:   1. Chronic left-sided low back pain without sciatica: Most like muscular strain. Started when patient did not have a bed and was sleeping on a thin mattress on the floor. Now will have spasms every now and then. Discussed supportive mattress and pillow, daily stretches, topical medications like IcyHot, heating pad and anti-inflammatories as needed.  2. Halitosis: Could be due to not eating until lunch time, recommend something bland like toast in the morning to help. Does have some mild sinus drainage on exam as well. Does have some acid reflux symptoms, can take over the counter Tums or anti-acid.   3. Hypokalemia/Elevated glucose: Repeat CMP.  - COMPLETE METABOLIC PANEL WITH GFR  4. Need for hepatitis C screening test/Encounter for screening for HIV: Screening due.  - HIV antibody (with reflex) - Hepatitis C Antibody   Return in about 1 year (around 02/07/2024).   Margarita Mail, DO

## 2024-02-27 ENCOUNTER — Ambulatory Visit
Admission: EM | Admit: 2024-02-27 | Discharge: 2024-02-27 | Disposition: A | Discharge status: Home / Self Care | Payer: Self-pay | Attending: Emergency Medicine | Admitting: Emergency Medicine

## 2024-02-27 ENCOUNTER — Ambulatory Visit: Payer: Self-pay

## 2024-02-27 DIAGNOSIS — R079 Chest pain, unspecified: Secondary | ICD-10-CM

## 2024-02-27 DIAGNOSIS — M436 Torticollis: Secondary | ICD-10-CM

## 2024-02-27 MED ORDER — METHOCARBAMOL 500 MG PO TABS: 500.0000 mg | ORAL_TABLET | Freq: Two times a day (BID) | ORAL | 0 refills | Status: AC | PRN

## 2024-02-27 MED ORDER — IBUPROFEN 600 MG PO TABS: 600.0000 mg | ORAL_TABLET | Freq: Four times a day (QID) | ORAL | 0 refills | Status: AC | PRN

## 2024-02-27 NOTE — ED Provider Notes (Signed)
 [facility]    CSN: [id number] Arrival date & time: 02/27/24  1606      History   Chief Complaint Chief Complaint  Patient presents with   Shoulder Pain   Torticollis    HPI Andrew Serrano is a 23 y.o. male.  Patient presents with 1 week history of left side neck and shoulder pain.  No falls or injury.  The pain initially started as pins-and-needles sensation and then progressed to discomfort with turning his head.  It is nonradiating.  He has no numbness, weakness, paresthesias.  The pain is improving without intervention.  He has not taken any over-the-counter medications.  Patient also reports intermittent heartburn which has been present for more than a year.  He occasionally takes Tums for this.  He also reports episodes of left side chest pain and shortness of breath intermittently for the past few months.  Patient is concerned that the pain in the left side of his neck and shoulder may be related to the heartburn and left-sided chest pain.  He denies numbness, weakness, fever, chills, cough, abdominal pain, nausea, vomiting.  Patient reports no pertinent medical history.  The history is provided by the patient and medical records.    History reviewed. No pertinent past medical history.  There are no active problems to display for this patient.   History reviewed. No pertinent surgical history.     Home Medications    Prior to Admission medications   Medication Sig Start Date End Date Taking? Authorizing Provider  ibuprofen  (ADVIL ) 600 MG tablet Take 1 tablet (600 mg total) by mouth every 6 (six) hours as needed. 02/27/24  Yes Corlis Burnard DEL, NP  methocarbamol  (ROBAXIN ) 500 MG tablet Take 1 tablet (500 mg total) by mouth 2 (two) times daily as needed for muscle spasms. 02/27/24  Yes Corlis Burnard DEL, NP    Family History Family History  Problem Relation Age of Onset   Diabetes Father     Social History Social History   Tobacco Use   Smoking  status: Some Days    Types: Cigars   Smokeless tobacco: Never  Vaping Use   Vaping status: Never Used  Substance Use Topics   Alcohol use: Yes    Comment: Socially   Drug use: No     Allergies   Patient has no known allergies.   Review of Systems Review of Systems  Constitutional:  Negative for chills and fever.  Respiratory:  Positive for shortness of breath. Negative for cough.   Cardiovascular:  Positive for chest pain. Negative for palpitations.  Gastrointestinal:  Negative for abdominal pain, diarrhea, nausea and vomiting.  Musculoskeletal:  Positive for neck pain. Negative for arthralgias, back pain, gait problem, joint swelling and neck stiffness.  Skin:  Negative for color change, rash and wound.  Neurological:  Negative for weakness and numbness.     Physical Exam Triage Vital Signs ED Triage Vitals  Encounter Vitals Group     BP      Girls Systolic BP Percentile      Girls Diastolic BP Percentile      Boys Systolic BP Percentile      Boys Diastolic BP Percentile      Pulse      Resp      Temp      Temp src      SpO2      Weight      Height      Head Circumference  Peak Flow      Pain Score      Pain Loc      Pain Education      Exclude from Growth Chart    No data found.  Updated Vital Signs BP 135/81   Pulse 94   Temp 97.6 F (36.4 C)   Resp 18   SpO2 98%   Visual Acuity Right Eye Distance:   Left Eye Distance:   Bilateral Distance:    Right Eye Near:   Left Eye Near:    Bilateral Near:     Physical Exam Constitutional:      General: He is not in acute distress. HENT:     Right Ear: Tympanic membrane normal.     Left Ear: Tympanic membrane normal.     Nose: Nose normal.     Mouth/Throat:     Mouth: Mucous membranes are moist.     Pharynx: Oropharynx is clear.  Cardiovascular:     Rate and Rhythm: Normal rate and regular rhythm.     Heart sounds: Normal heart sounds.  Pulmonary:     Effort: Pulmonary effort is normal.  No respiratory distress.     Breath sounds: Normal breath sounds.  Abdominal:     General: Bowel sounds are normal.     Palpations: Abdomen is soft.     Tenderness: There is no abdominal tenderness. There is no guarding or rebound.  Musculoskeletal:        General: No swelling, tenderness or deformity. Normal range of motion.     Comments: Neck and left shoulder: FROM, nontender, strength 5/5, sensation intact. Spine nontender.   Skin:    General: Skin is warm and dry.     Capillary Refill: Capillary refill takes less than 2 seconds.     Findings: No bruising, erythema, lesion or rash.  Neurological:     General: No focal deficit present.     Mental Status: He is alert and oriented to person, place, and time.     Sensory: No sensory deficit.     Motor: No weakness.     Gait: Gait normal.      UC Treatments / Results  Labs (all labs ordered are listed, but only abnormal results are displayed) Labs Reviewed - No data to display  EKG   Radiology No results found.  Procedures Procedures (including critical care time)  Medications Ordered in UC Medications - No data to display  Initial Impression / Assessment and Plan / UC Course  I have reviewed the triage vital signs and the nursing notes.  Pertinent labs & imaging results that were available during my care of the patient were reviewed by me and considered in my medical decision making (see chart for details).   Chest pain, torticollis.  Afebrile and vital signs are stable.  Exam is reassuring.  EKG shows sinus rhythm, rate 93, no ST elevation, compared to previous from 11/14/2022.  Patient declines x-ray of his neck (no trauma).  Treating today with ibuprofen  and methocarbamol .  Precautions for drowsiness with methocarbamol  discussed.  Instructed patient to follow-up with his PCP tomorrow.  ED precautions given.  Education provided on chest pain and torticollis.  He agrees to plan of care.  Final Clinical Impressions(s) /  UC Diagnoses   Final diagnoses:  Chest pain, unspecified type  Torticollis     Discharge Instructions      Follow up with your primary care provider tomorrow.  Go to the emergency department if you  have worsening symptoms.    Take the ibuprofen  as needed for discomfort.  Take the muscle relaxer as needed for muscle spasm; Do not drive, operate machinery, or drink alcohol with this medication as it can cause drowsiness.       ED Prescriptions     Medication Sig Dispense Auth. Provider   methocarbamol  (ROBAXIN ) 500 MG tablet Take 1 tablet (500 mg total) by mouth 2 (two) times daily as needed for muscle spasms. 10 tablet Corlis Burnard DEL, NP   ibuprofen  (ADVIL ) 600 MG tablet Take 1 tablet (600 mg total) by mouth every 6 (six) hours as needed. 30 tablet Corlis Burnard DEL, NP      PDMP not reviewed this encounter.   Corlis Burnard DEL, NP 02/27/24 [phone]

## 2024-02-27 NOTE — ED Triage Notes (Signed)
 Patient to Urgent Care with complaints of left sided neck pain/ shoulder pain. Reports intermittent, pins and needles.   Symptoms x1 week. Now having pain when turning his head to the left side. Describes a pulsing pain now. Denies any known injury (possibly slept wrong).  Also reports increased heart burn this week. Taking tums as needed. No other otc meds.

## 2024-02-27 NOTE — Discharge Instructions (Addendum)
 Follow up with your primary care provider tomorrow.  Go to the emergency department if you have worsening symptoms.    Take the ibuprofen  as needed for discomfort.  Take the muscle relaxer as needed for muscle spasm; Do not drive, operate machinery, or drink alcohol with this medication as it can cause drowsiness.
